# Patient Record
Sex: Female | Born: 1995 | Race: Black or African American | Hispanic: No | Marital: Single | Smoking: Never smoker
Health system: Southern US, Community
[De-identification: ages and names within clinical notes are randomized; demographics above are authoritative.]

## PROBLEM LIST (undated history)

## (undated) ENCOUNTER — Ambulatory Visit (HOSPITAL_COMMUNITY): Admission: EM | Discharge status: Against Medical Advice | Payer: BLUE CROSS/BLUE SHIELD

## (undated) DIAGNOSIS — N809 Endometriosis, unspecified: Secondary | ICD-10-CM

## (undated) DIAGNOSIS — D649 Anemia, unspecified: Secondary | ICD-10-CM

## (undated) DIAGNOSIS — Z789 Other specified health status: Secondary | ICD-10-CM

## (undated) DIAGNOSIS — D219 Benign neoplasm of connective and other soft tissue, unspecified: Secondary | ICD-10-CM

## (undated) HISTORY — DX: Endometriosis, unspecified: N80.9

## (undated) HISTORY — PX: NO PAST SURGERIES: SHX2092

## (undated) HISTORY — PX: UTERINE FIBROID SURGERY: SHX826

## (undated) HISTORY — DX: Anemia, unspecified: D64.9

## (undated) HISTORY — PX: SKIN GRAFT: SHX250

## (undated) HISTORY — DX: Benign neoplasm of connective and other soft tissue, unspecified: D21.9

---
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
## 2020-06-28 ENCOUNTER — Other Ambulatory Visit: Payer: Self-pay

## 2020-06-28 ENCOUNTER — Encounter (HOSPITAL_COMMUNITY): Payer: Self-pay

## 2020-06-28 ENCOUNTER — Ambulatory Visit (INDEPENDENT_AMBULATORY_CARE_PROVIDER_SITE_OTHER): Payer: BC Managed Care – PPO

## 2020-06-28 ENCOUNTER — Ambulatory Visit (HOSPITAL_COMMUNITY)
Admission: EM | Admit: 2020-06-28 | Discharge: 2020-06-28 | Disposition: A | Discharge status: Home / Self Care | Payer: BC Managed Care – PPO | Attending: Family Medicine | Admitting: Family Medicine

## 2020-06-28 DIAGNOSIS — M79672 Pain in left foot: Secondary | ICD-10-CM

## 2020-06-28 DIAGNOSIS — W19XXXA Unspecified fall, initial encounter: Secondary | ICD-10-CM

## 2020-06-28 DIAGNOSIS — S92342A Displaced fracture of fourth metatarsal bone, left foot, initial encounter for closed fracture: Secondary | ICD-10-CM

## 2020-06-28 DIAGNOSIS — S92332A Displaced fracture of third metatarsal bone, left foot, initial encounter for closed fracture: Secondary | ICD-10-CM | POA: Diagnosis not present

## 2020-06-28 MED ORDER — IBUPROFEN 600 MG PO TABS: 600.0000 mg | ORAL_TABLET | Freq: Four times a day (QID) | ORAL | 0 refills | Status: DC | PRN

## 2020-06-28 NOTE — ED Triage Notes (Signed)
Pt presents with left foot & ankle pain and swelling after a fall on ice this morning.

## 2020-06-28 NOTE — ED Provider Notes (Signed)
[facility]    CSN: [id number] Arrival date & time: 06/28/20  1153      History   Chief Complaint Chief Complaint  Patient presents with  . Ankle Pain  . Toe Pain  . Fall    HPI Sheila Ward is a 25 y.o. female.   Patient presents with pain and swelling in her left foot after slipping and falling this morning on the ice.  No head injury or loss of consciousness.  She denies numbness, weakness, paresthesias, open wounds, redness, bruising, or other symptoms.  No treatments attempted at home.  Her medical history includes iron deficiency anemia.  The history is provided by the patient and medical records.    Past Medical History:  Diagnosis Date  . Medical history non-contributory     Patient Active Problem List   Diagnosis Date Noted  . Endometrial mass 08/18/2013  . Anemia 08/12/2013  . Gonorrhea in female - hx June '14  12/04/2012  . Chlamydia trachomatis infection of lower genitourinary sites - hx June 2014  12/04/2012  . Abnormal uterine bleeding 12/03/2012  . Iron deficiency anemia secondary to blood loss (chronic) 12/03/2012    Past Surgical History:  Procedure Laterality Date  . DILITATION & CURRETTAGE/HYSTROSCOPY WITH VERSAPOINT RESECTION N/A 08/18/2013   Procedure: DILATATION & CURETTAGE/HYSTEROSCOPY WITH VERSAPOINT RESECTION;  Surgeon: Eldred Manges, MD;  Location: [facility];  Service: Gynecology;  Laterality: N/A;  . NO PAST SURGERIES    . SKIN GRAFT     as a child    OB History    Gravida  0   Para  0   Term  0   Preterm  0   AB  0   Living  0     SAB  0   IAB  0   Ectopic  0   Multiple  0   Live Births               Home Medications    Prior to Admission medications   Medication Sig Start Date End Date Taking? Authorizing Provider  ibuprofen (ADVIL) 600 MG tablet Take 1 tablet (600 mg total) by mouth every 6 (six) hours as needed. 06/28/20  Yes Sharion Balloon, NP  acetaminophen-codeine (TYLENOL #3)  300-30 MG per tablet Take 2 tablets by mouth every 4 (four) hours as needed for moderate pain.    [provider]  ferrous sulfate (FEOSOL) 325 (65 FE) MG tablet Take 1 tablet (325 mg total) by mouth 2 (two) times daily with a meal. 08/18/13   Haygood, Seymour Bars, MD    Family History Family History  Problem Relation Age of Onset  . Liver cancer Father     Social History Social History   Tobacco Use  . Smoking status: Never Smoker  . Smokeless tobacco: Never Used  Substance Use Topics  . Alcohol use: No  . Drug use: No     Allergies   Patient has no known allergies.   Review of Systems Review of Systems  Constitutional: Negative for chills and fever.  HENT: Negative for ear pain and sore throat.   Eyes: Negative for pain and visual disturbance.  Respiratory: Negative for cough and shortness of breath.   Cardiovascular: Negative for chest pain and palpitations.  Gastrointestinal: Negative for abdominal pain and vomiting.  Genitourinary: Negative for dysuria and hematuria.  Musculoskeletal: Positive for arthralgias. Negative for back pain.  Skin: Negative for color change, rash and wound.  Neurological:  Negative for syncope, weakness and numbness.  All other systems reviewed and are negative.    Physical Exam Triage Vital Signs ED Triage Vitals  Enc Vitals Group     BP      Pulse      Resp      Temp      Temp src      SpO2      Weight      Height      Head Circumference      Peak Flow      Pain Score      Pain Loc      Pain Edu?      Excl. in [place]?    No data found.  Updated Vital Signs BP 118/81 (BP Location: Right Arm)   Pulse 75   Temp 98.8 F (37.1 C) (Oral)   Resp 18   LMP 06/26/2020   SpO2 98%   Visual Acuity Right Eye Distance:   Left Eye Distance:   Bilateral Distance:    Right Eye Near:   Left Eye Near:    Bilateral Near:     Physical Exam Vitals and nursing note reviewed.  Constitutional:      General: She is not in  acute distress.    Appearance: She is well-developed and well-nourished. She is not ill-appearing.  HENT:     Head: Normocephalic and atraumatic.     Mouth/Throat:     Mouth: Mucous membranes are moist.  Eyes:     Conjunctiva/sclera: Conjunctivae normal.  Cardiovascular:     Rate and Rhythm: Normal rate and regular rhythm.     Heart sounds: Normal heart sounds.  Pulmonary:     Effort: Pulmonary effort is normal. No respiratory distress.     Breath sounds: Normal breath sounds.  Abdominal:     Palpations: Abdomen is soft.     Tenderness: There is no abdominal tenderness.  Musculoskeletal:        General: Swelling and tenderness present. No deformity or edema. Normal range of motion.     Cervical back: Neck supple.       Feet:  Skin:    General: Skin is warm and dry.     Capillary Refill: Capillary refill takes less than 2 seconds.     Findings: No bruising, erythema, lesion or rash.  Neurological:     General: No focal deficit present.     Mental Status: She is alert and oriented to person, place, and time.     Sensory: No sensory deficit.     Motor: No weakness.     Gait: Gait abnormal.     Comments: In wheelchair.  Psychiatric:        Mood and Affect: Mood and affect and mood normal.        Behavior: Behavior normal.      UC Treatments / Results  Labs (all labs ordered are listed, but only abnormal results are displayed) Labs Reviewed - No data to display  EKG   Radiology DG Foot Complete Left  Result Date: 06/28/2020 CLINICAL DATA:  Left foot pain after fall today. EXAM: LEFT FOOT - COMPLETE 3+ VIEW COMPARISON:  None. FINDINGS: Minimally displaced fractures are seen involving the distal portions of the third and fourth metatarsals. No other bony abnormality is noted. Joint spaces are intact. IMPRESSION: Minimally displaced third and fourth metatarsal fractures. Electronically Signed   By: Marijo Conception M.D.   On: 06/28/2020 14:00    Procedures  Procedures  (including critical care time)  Medications Ordered in UC Medications - No data to display  Initial Impression / Assessment and Plan / UC Course  I have reviewed the triage vital signs and the nursing notes.  Pertinent labs & imaging results that were available during my care of the patient were reviewed by me and considered in my medical decision making (see chart for details).   Close minimally displaced fractures of the third and fourth metatarsals of left foot.  Treating with rest, elevation, ice packs, ibuprofen, walking boot, crutches.  Instructed patient to follow-up with orthopedics.  She agrees to plan of care.   Final Clinical Impressions(s) / UC Diagnoses   Final diagnoses:  Closed displaced fracture of third metatarsal bone of left foot, initial encounter  Closed displaced fracture of fourth metatarsal bone of left foot, initial encounter     Discharge Instructions     Take the ibuprofen as prescribed.  Rest and elevate your foot.  Apply ice packs 2-3 times a day for up to 20 minutes each.  Wear the walking boot and use the crutches.   Schedule an appointment with an orthopedist such as the one listed below.         ED Prescriptions    Medication Sig Dispense Auth. Provider   ibuprofen (ADVIL) 600 MG tablet Take 1 tablet (600 mg total) by mouth every 6 (six) hours as needed. 30 tablet Sharion Balloon, NP     PDMP not reviewed this encounter.   Sharion Balloon, NP 06/28/20 [phone]

## 2020-06-28 NOTE — Discharge Instructions (Signed)
Take the ibuprofen as prescribed.  Rest and elevate your foot.  Apply ice packs 2-3 times a day for up to 20 minutes each.  Wear the walking boot and use the crutches.   Schedule an appointment with an orthopedist such as the one listed below.

## 2021-07-22 ENCOUNTER — Other Ambulatory Visit (HOSPITAL_COMMUNITY): Payer: Self-pay

## 2021-07-22 MED ORDER — OZEMPIC (0.25 OR 0.5 MG/DOSE) 2 MG/1.5ML ~~LOC~~ SOPN
0.2500 mg | PEN_INJECTOR | SUBCUTANEOUS | 0 refills | Status: DC
  Filled 2021-07-22 – 2021-08-16 (×6): qty 1.5, 56d supply, fill #0

## 2021-07-25 ENCOUNTER — Other Ambulatory Visit (HOSPITAL_COMMUNITY): Payer: Self-pay

## 2021-07-30 ENCOUNTER — Other Ambulatory Visit (HOSPITAL_COMMUNITY): Payer: Self-pay

## 2021-08-05 ENCOUNTER — Other Ambulatory Visit (HOSPITAL_COMMUNITY): Payer: Self-pay

## 2021-08-07 ENCOUNTER — Other Ambulatory Visit (HOSPITAL_COMMUNITY): Payer: Self-pay

## 2021-08-13 IMAGING — DX DG FOOT COMPLETE 3+V*L*
3 series · 3 of 3 positions shown · non-contrast
Comparison: None.

CLINICAL DATA: Left foot pain after fall today.

EXAM:
LEFT FOOT - COMPLETE 3+ VIEW

[foot ap]
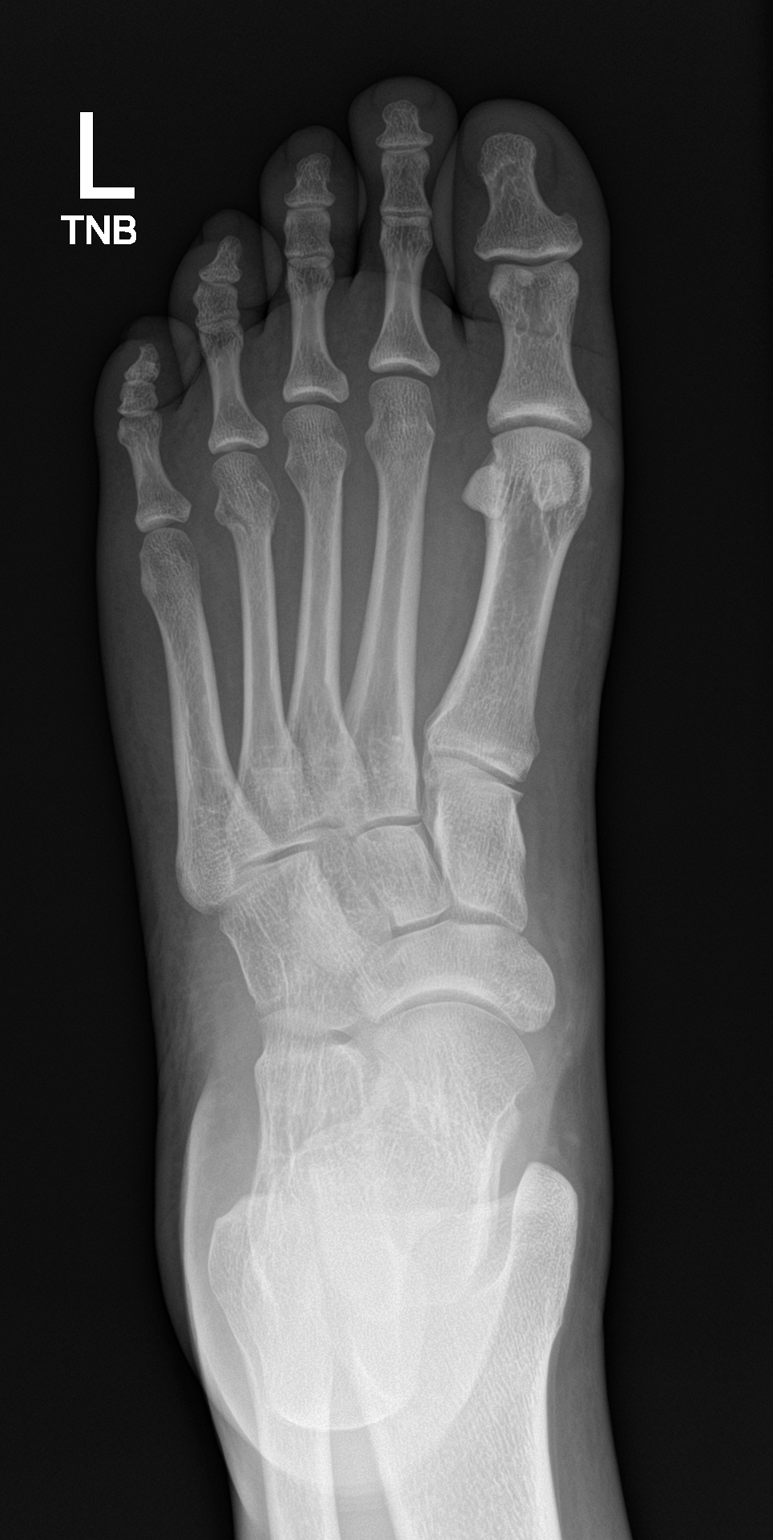

[foot obl]
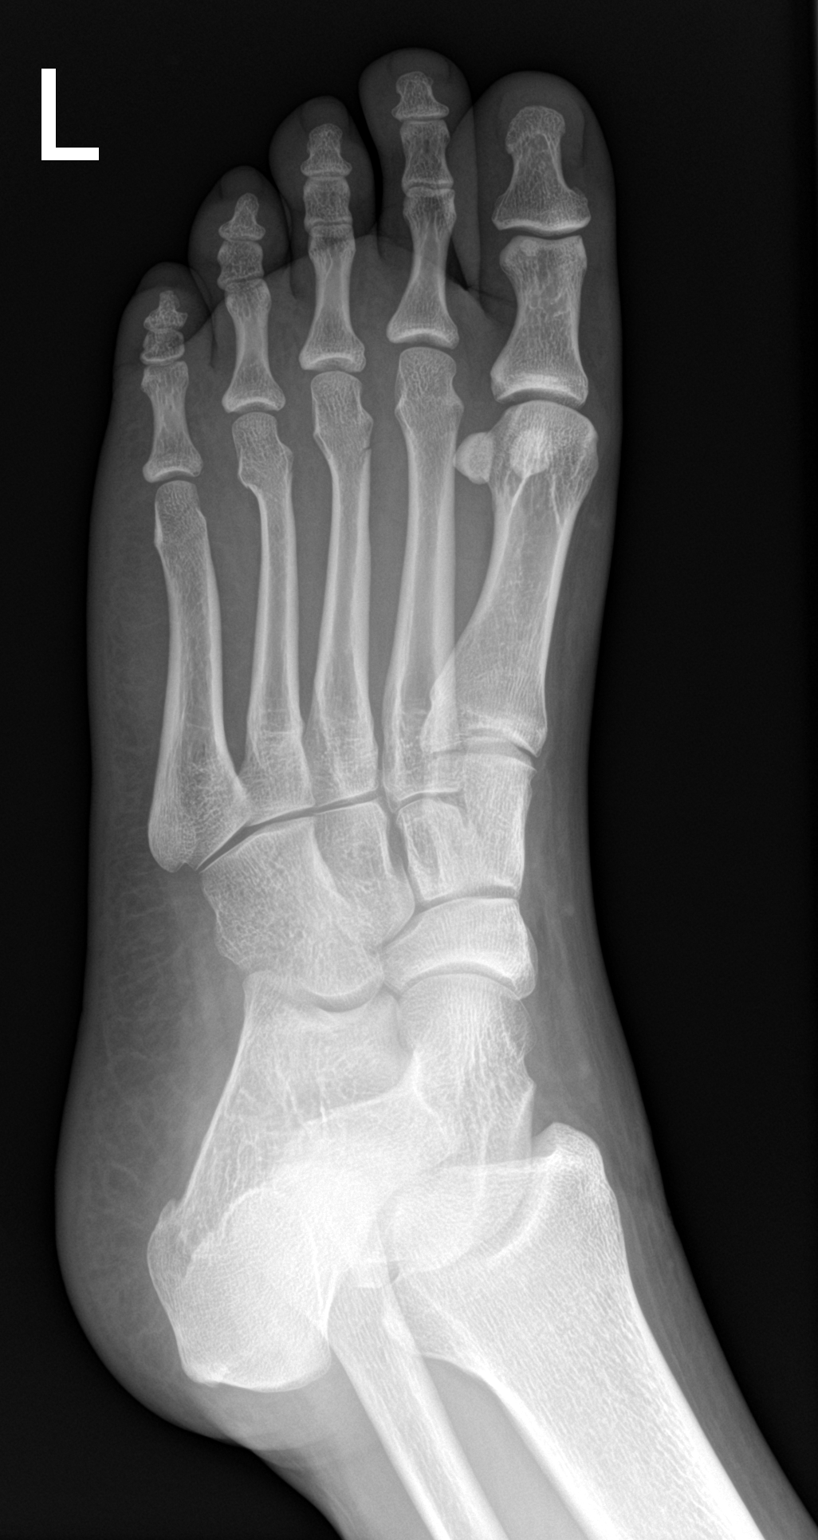

[foot lat]
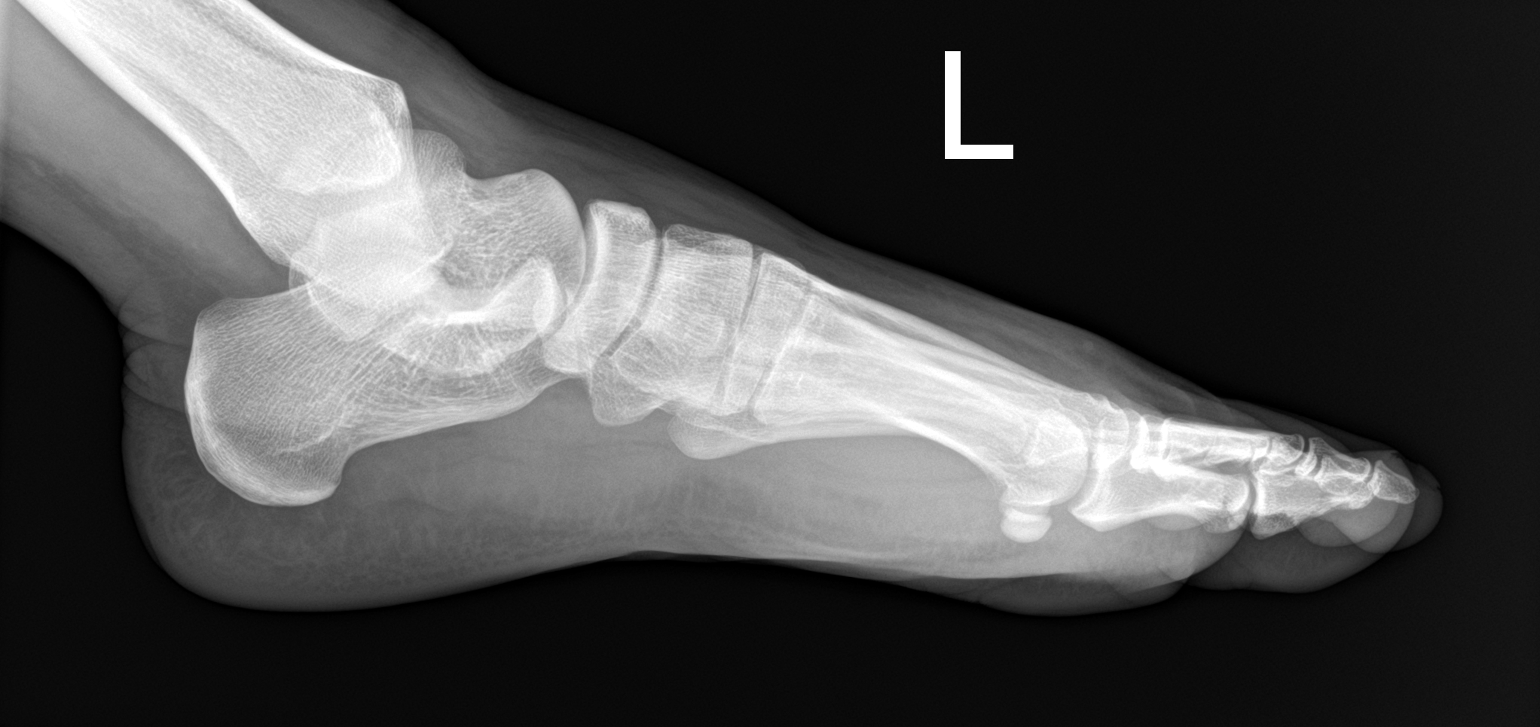

[3 of 3 positions shown; findings below may reference images not displayed]

FINDINGS: Minimally displaced fractures are seen involving the distal portions
of the third and fourth metatarsals. No other bony abnormality is
noted. Joint spaces are intact.
IMPRESSION: Minimally displaced third and fourth metatarsal fractures.

## 2021-08-15 ENCOUNTER — Other Ambulatory Visit (HOSPITAL_COMMUNITY): Payer: Self-pay

## 2021-08-16 ENCOUNTER — Other Ambulatory Visit (HOSPITAL_COMMUNITY): Payer: Self-pay

## 2021-11-15 ENCOUNTER — Ambulatory Visit: Payer: Self-pay | Admitting: Nurse Practitioner

## 2021-11-15 ENCOUNTER — Encounter: Payer: Self-pay | Admitting: Nurse Practitioner

## 2021-11-15 ENCOUNTER — Ambulatory Visit (INDEPENDENT_AMBULATORY_CARE_PROVIDER_SITE_OTHER): Payer: No Typology Code available for payment source | Admitting: Nurse Practitioner

## 2021-11-15 VITALS — BP 117/80 | HR 92 | Ht 64.0 in | Wt 200.0 lb

## 2021-11-15 DIAGNOSIS — E669 Obesity, unspecified: Secondary | ICD-10-CM | POA: Diagnosis not present

## 2021-11-15 DIAGNOSIS — D500 Iron deficiency anemia secondary to blood loss (chronic): Secondary | ICD-10-CM | POA: Diagnosis not present

## 2021-11-15 DIAGNOSIS — G4489 Other headache syndrome: Secondary | ICD-10-CM

## 2021-11-15 DIAGNOSIS — D508 Other iron deficiency anemias: Secondary | ICD-10-CM | POA: Diagnosis not present

## 2021-11-15 NOTE — Progress Notes (Signed)
New Patient Office Visit  Subjective    Patient ID: Sheila Ward, female    DOB: 1995-08-25  Age: 26 y.o. MRN: [id number]  CC:  Chief Complaint  Patient presents with   New Patient (Initial Visit)    NP   Headache    Constant on and off for the last couple of years    HPI Sheila Ward with past medical history of iron deficiency anemia, endometriosis, fibroids presents to establish care.  Patient states that she goes to an OB/GYN located in [place] last annual exam was in November 2022 stated that she had lab work done also during that visit.   Patient stated that she has has had 3 HPV vaccines, last Pap smear was in 2022 stated that the result was normal, not sure if she has had Tdap vaccine in the past 10 years.  Patient encouraged to check her vaccine record and get Tdap vaccine if she has not been vaccinated in the past 10 years..   Pt c/o chronic intermittent HA well controlled with tylenol, states that she gets mild headache at least once a week patient denies nausea, vomiting, changes in her vision states that the headache does not intervene with her daily activities.  States that she would like her CBC checked today because she had experienced headaches in the past when she was anemic.  She denies heavy menstrual period.  Currently not taking iron supplement states that her OB/GYN told her sometime ago that she did not need to be taking iron supplement anymore.   Outpatient Encounter Medications as of 11/15/2021  Medication Sig   b complex vitamins capsule Take 1 capsule by mouth daily. Once a day   ibuprofen (ADVIL) 600 MG tablet Take 1 tablet (600 mg total) by mouth every 6 (six) hours as needed.   Probiotic Product (PROBIOTIC DAILY PO) Take by mouth. Once a day   ferrous sulfate (FEOSOL) 325 (65 FE) MG tablet Take 1 tablet (325 mg total) by mouth 2 (two) times daily with a meal. (Patient not taking: Reported on 11/15/2021)   [DISCONTINUED]  acetaminophen-codeine (TYLENOL #3) 300-30 MG per tablet Take 2 tablets by mouth every 4 (four) hours as needed for moderate pain. (Patient not taking: Reported on 11/15/2021)   [DISCONTINUED] Semaglutide,0.25 or 0.'5MG'$ /DOS, (OZEMPIC, 0.25 OR 0.5 MG/DOSE,) 2 MG/1.5ML SOPN Inject 0.25 mg into the skin once a week. (Patient not taking: Reported on 11/15/2021)   No facility-administered encounter medications on file as of 11/15/2021.    Past Medical History:  Diagnosis Date   Anemia    Endometriosis    Fibroids    Medical history non-contributory     Past Surgical History:  Procedure Laterality Date   DILITATION & CURRETTAGE/HYSTROSCOPY WITH VERSAPOINT RESECTION N/A 08/18/2013   Procedure: DILATATION & CURETTAGE/HYSTEROSCOPY WITH VERSAPOINT RESECTION;  Surgeon: Eldred Manges, MD;  Location: [facility];  Service: Gynecology;  Laterality: N/A;   NO PAST SURGERIES     SKIN GRAFT     as a child    Family History  Problem Relation Age of Onset   Liver cancer Father    Pancreatic cancer Father     Social History   Socioeconomic History   Marital status: Single    Spouse name: Not on file   Number of children: Not on file   Years of education: Not on file   Highest education level: Not on file  Occupational History   Occupation: Student    Comment: [place]  Tobacco Use   Smoking status: Never   Smokeless tobacco: Never  Substance and Sexual Activity   Alcohol use: Yes    Comment: occassionally   Drug use: No   Sexual activity: Yes  Other Topics Concern   Not on file  Social History Narrative   Lives by herself.     Social Determinants of Health   Financial Resource Strain: Not on file  Food Insecurity: Not on file  Transportation Needs: Not on file  Physical Activity: Not on file  Stress: Not on file  Social Connections: Not on file  Intimate Partner Violence: Not on file    Review of Systems  Constitutional: Negative.  Negative for chills, fever and malaise/fatigue.   Eyes: Negative.  Negative for blurred vision, double vision and photophobia.  Respiratory: Negative.  Negative for cough, hemoptysis, sputum production and shortness of breath.   Cardiovascular: Negative.  Negative for chest pain, palpitations and orthopnea.  Gastrointestinal: Negative.  Negative for nausea and vomiting.  Musculoskeletal: Negative.  Negative for back pain, myalgias and neck pain.  Neurological:  Positive for headaches. Negative for dizziness, tingling, tremors, sensory change and speech change.  Psychiatric/Behavioral: Negative.  Negative for depression, hallucinations, substance abuse and suicidal ideas.         Objective    BP 117/80 (BP Location: Right Arm, Patient Position: Sitting, Cuff Size: Large)   Pulse 92   Ht '5\' 4"'$  (1.626 m)   Wt 200 lb (90.7 kg)   LMP 11/01/2021 (Approximate)   SpO2 96%   BMI 34.33 kg/m   Physical Exam Constitutional:      General: She is not in acute distress.    Appearance: She is obese. She is not ill-appearing or toxic-appearing.  HENT:     Head: Normocephalic and atraumatic.  Eyes:     General: No visual field deficit or scleral icterus.    Extraocular Movements: Extraocular movements intact.     Pupils: Pupils are equal, round, and reactive to light. Pupils are equal.  Cardiovascular:     Rate and Rhythm: Normal rate and regular rhythm.     Heart sounds: Normal heart sounds. No murmur heard.    No friction rub. No gallop.  Pulmonary:     Effort: Pulmonary effort is normal. No respiratory distress.     Breath sounds: Normal breath sounds. No stridor. No wheezing, rhonchi or rales.  Chest:     Chest wall: No tenderness.  Musculoskeletal:     Cervical back: Normal range of motion. No rigidity.  Skin:    Capillary Refill: Capillary refill takes less than 2 seconds.  Neurological:     Mental Status: She is alert and oriented to person, place, and time.     GCS: GCS eye subscore is 4. GCS verbal subscore is 5. GCS motor  subscore is 6.     Cranial Nerves: No cranial nerve deficit, dysarthria or facial asymmetry.     Sensory: No sensory deficit.     Motor: No weakness.     Coordination: Romberg sign negative. Coordination normal.     Gait: Gait normal.  Psychiatric:        Mood and Affect: Mood normal. Mood is not anxious or depressed.        Speech: Speech normal.        Behavior: Behavior is not agitated.        Cognition and Memory: Cognition is not impaired. Memory is not impaired.  Assessment & Plan:   Problem List Items Addressed This Visit       Other   Iron deficiency anemia secondary to blood loss (chronic)    Currently not on iron supplement Has been having intermittent headache Check CBC      Relevant Orders   CBC with Differential   Other headache syndrome    Checking CBC today Continue Tylenol as needed      Obesity (BMI 30-39.9)    Wt Readings from Last 3 Encounters:  11/15/21 200 lb (90.7 kg)  08/17/13 143 lb (64.9 kg) (79 %, Z= 0.81)*  08/12/13 143 lb (64.9 kg) (79 %, Z= 0.81)*   * Growth percentiles are based on CDC (Girls, 2-20 Years) data.  Need to increase intake of whole food consisting mainly vegetables and protein less carbohydrate drinking at least 64 ounces of water daily, engaging in regular vigorous exercises at least 30 minutes daily, importance of portion control also discussed.  Weight loss medications discussed with patient, she has tried getting Wegovy in the past but her insurance did not cover the medication.  Other weight loss medications like phentermine discussed with patient patient told to get back with [place] if she consider taking phentermine      RESOLVED: Anemia - Primary    No follow-ups on file.   Renee Rival, FNP

## 2021-11-15 NOTE — Assessment & Plan Note (Signed)
Checking CBC today Continue Tylenol as needed

## 2021-11-15 NOTE — Patient Instructions (Signed)

## 2021-11-15 NOTE — Assessment & Plan Note (Signed)
Currently not on iron supplement Has been having intermittent headache Check CBC

## 2021-11-15 NOTE — Assessment & Plan Note (Signed)
Wt Readings from Last 3 Encounters:  11/15/21 200 lb (90.7 kg)  08/17/13 143 lb (64.9 kg) (79 %, Z= 0.81)*  08/12/13 143 lb (64.9 kg) (79 %, Z= 0.81)*   * Growth percentiles are based on CDC (Girls, 2-20 Years) data.  Need to increase intake of whole food consisting mainly vegetables and protein less carbohydrate drinking at least 64 ounces of water daily, engaging in regular vigorous exercises at least 30 minutes daily, importance of portion control also discussed.  Weight loss medications discussed with patient, she has tried getting Wegovy in the past but her insurance did not cover the medication.  Other weight loss medications like phentermine discussed with patient patient told to get back with [place] if she consider taking phentermine

## 2021-11-16 LAB — CBC WITH DIFFERENTIAL/PLATELET
Basophils Absolute: 0 10*3/uL (ref 0.0–0.2)
Basos: 0 %
EOS (ABSOLUTE): 0.1 10*3/uL (ref 0.0–0.4)
Eos: 2 %
Hematocrit: 40 % (ref 34.0–46.6)
Hemoglobin: 13.2 g/dL (ref 11.1–15.9)
Immature Grans (Abs): 0.1 10*3/uL (ref 0.0–0.1)
Immature Granulocytes: 1 %
Lymphocytes Absolute: 2 10*3/uL (ref 0.7–3.1)
Lymphs: 27 %
MCH: 27.7 pg (ref 26.6–33.0)
MCHC: 33 g/dL (ref 31.5–35.7)
MCV: 84 fL (ref 79–97)
Monocytes Absolute: 0.7 10*3/uL (ref 0.1–0.9)
Monocytes: 10 %
Neutrophils Absolute: 4.3 10*3/uL (ref 1.4–7.0)
Neutrophils: 60 %
Platelets: 403 10*3/uL (ref 150–450)
RBC: 4.77 x10E6/uL (ref 3.77–5.28)
RDW: 13.4 % (ref 11.7–15.4)
WBC: 7.2 10*3/uL (ref 3.4–10.8)

## 2021-11-17 NOTE — Progress Notes (Signed)
Normal CBC , no indication for anemia.

## 2021-11-25 ENCOUNTER — Encounter: Payer: Self-pay | Admitting: Student

## 2021-11-25 ENCOUNTER — Ambulatory Visit (INDEPENDENT_AMBULATORY_CARE_PROVIDER_SITE_OTHER): Payer: No Typology Code available for payment source | Admitting: Student

## 2021-11-25 VITALS — BP 129/83 | HR 100 | Temp 98.4°F | Ht 64.0 in | Wt 201.3 lb

## 2021-11-25 DIAGNOSIS — D500 Iron deficiency anemia secondary to blood loss (chronic): Secondary | ICD-10-CM

## 2021-11-25 NOTE — Progress Notes (Signed)
CC: new to establish PCP  HPI:  [name] is a 26 y.o. female with history listed below presenting to the [facility] to establish care with new PCP. Please see individualized problem based charting for full HPI.  Past Medical History:  Diagnosis Date   Anemia    Endometriosis    Fibroids    Medical history non-contributory    Past Surgical History:  Procedure Laterality Date   DILITATION & CURRETTAGE/HYSTROSCOPY WITH VERSAPOINT RESECTION N/A 08/18/2013   Procedure: DILATATION & CURETTAGE/HYSTEROSCOPY WITH VERSAPOINT RESECTION;  Surgeon: Eldred Manges, MD;  Location: [facility];  Service: Gynecology;  Laterality: N/A;   NO PAST SURGERIES     SKIN GRAFT     as a child   UTERINE FIBROID SURGERY     Fibroids removed x2 (2014, 2015)   Current Outpatient Medications on File Prior to Visit  Medication Sig Dispense Refill   b complex vitamins capsule Take 1 capsule by mouth daily. Once a day     ferrous sulfate (FEOSOL) 325 (65 FE) MG tablet Take 1 tablet (325 mg total) by mouth 2 (two) times daily with a meal. (Patient not taking: Reported on 11/15/2021) 60 tablet 3   ibuprofen (ADVIL) 600 MG tablet Take 1 tablet (600 mg total) by mouth every 6 (six) hours as needed. 30 tablet 0   Probiotic Product (PROBIOTIC DAILY PO) Take by mouth. Once a day     No current facility-administered medications on file prior to visit.   Allergies  Allergen Reactions   Nickel Rash    Social History   Socioeconomic History   Marital status: Single    Spouse name: Not on file   Number of children: Not on file   Years of education: Not on file   Highest education level: Not on file  Occupational History   Occupation: Student    Comment: [employer]  Tobacco Use   Smoking status: Never   Smokeless tobacco: Never  Vaping Use   Vaping Use: Never used  Substance and Sexual Activity   Alcohol use: Yes    Comment: social   Drug use: No   Sexual activity: Yes    Birth  control/protection: Condom  Other Topics Concern   Not on file  Social History Narrative   Lives with mother.    Social Determinants of Health   Financial Resource Strain: Not on file  Food Insecurity: Not on file  Transportation Needs: Not on file  Physical Activity: Not on file  Stress: Not on file  Social Connections: Not on file  Intimate Partner Violence: Not on file    Review of Systems:  Negative aside from that listed in individualized problem based charting.  Physical Exam:  Vitals:   11/25/21 1536  BP: 129/83  Pulse: 100  Temp: 98.4 F (36.9 C)  TempSrc: Oral  SpO2: 100%  Weight: 201 lb 4.8 oz (91.3 kg)  Height: '5\' 4"'$  (1.626 m)   Physical Exam Constitutional:      Appearance: Normal appearance. She is obese. She is not ill-appearing.  HENT:     Nose: Nose normal.     Mouth/Throat:     Mouth: Mucous membranes are moist.     Pharynx: Oropharynx is clear.  Eyes:     General: No scleral icterus.    Extraocular Movements: Extraocular movements intact.     Conjunctiva/sclera: Conjunctivae normal.     Pupils: Pupils are equal, round, and reactive to light.  Cardiovascular:  Rate and Rhythm: Normal rate and regular rhythm.     Pulses: Normal pulses.     Heart sounds: Normal heart sounds. No murmur heard.    No friction rub. No gallop.  Pulmonary:     Effort: Pulmonary effort is normal.     Breath sounds: Normal breath sounds. No wheezing, rhonchi or rales.  Abdominal:     General: Bowel sounds are normal. There is no distension.     Palpations: Abdomen is soft.     Tenderness: There is no abdominal tenderness.  Musculoskeletal:        General: No swelling. Normal range of motion.     Cervical back: Normal range of motion.  Skin:    General: Skin is warm and dry.  Neurological:     General: No focal deficit present.     Mental Status: She is alert and oriented to person, place, and time.  Psychiatric:        Mood and Affect: Mood normal.         Behavior: Behavior normal.        Thought Content: Thought content normal.        Judgment: Judgment normal.      Assessment & Plan:   See Encounters Tab for problem based charting.  Patient discussed with Dr. Daryll Drown

## 2021-11-25 NOTE — Assessment & Plan Note (Signed)
Patient with history of IDA 2/2 chronic blood loss from endometriosis. She has a CBC from 2 weeks ago showing normal blood count and MCV. She states that in the past she was diagnosed with IDA and took iron supplementation for a long time before stopping about 1 year ago as per OBGYN. When she becomes iron deficient, her primary symptom is development of intermittent headaches. States that she has been experiencing headaches about 2-3 times per week recently and is worried that she is iron deficient. Fortunately, headaches typically self-resolve or resolve with the use of tylenol. She denies any increased menstrual bleeding recently, but wants to ensure her iron levels are adequate given symptomology. I think this is appropriate given her history.  Plan: -f/u iron studies -tylenol prn for headaches  ADDENDUM: Iron/TIBC/Ferritin/ %Sat    Component Value Date/Time   IRON 71 11/25/2021 1627   TIBC 353 11/25/2021 1627   FERRITIN 38 11/25/2021 1627   IRONPCTSAT 20 11/25/2021 1627   Iron labs showing adequate iron stores. No need for supplementation at this time. Discussed results with patient.

## 2021-11-25 NOTE — Patient Instructions (Signed)
Sheila Ward,  It was a pleasure seeing you in the clinic today.   We will check your iron levels today. I will give you a call once I have the results. Please take tylenol as needed for your headaches. Please come back in 6 months for your next visit, or sooner if you need to.  Please call our clinic at [phone] if you have any questions or concerns. The best time to call is Monday-Friday from 9am-4pm, but there is someone available 24/7 at the same number. If you need medication refills, please notify your pharmacy one week in advance and they will send [place] a request.   Thank you for letting us take part in your care. We look forward to seeing you next time!

## 2021-11-26 LAB — FERRITIN: Ferritin: 38 ng/mL (ref 15–150)

## 2021-11-26 LAB — IRON AND TIBC
Iron Saturation: 20 % (ref 15–55)
Iron: 71 ug/dL (ref 27–159)
Total Iron Binding Capacity: 353 ug/dL (ref 250–450)
UIBC: 282 ug/dL (ref 131–425)

## 2021-11-26 NOTE — Progress Notes (Deleted)
Patient called.  Left message for patient to call back. Iron levels within normal limits.
